# Patient Record
Sex: Male | Born: 2000 | Race: White | Hispanic: No | Marital: Single | Smoking: Never smoker
Health system: Southern US, Community
[De-identification: ages and names within clinical notes are randomized; demographics above are authoritative.]

---
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
## 2019-08-05 ENCOUNTER — Ambulatory Visit: Payer: Self-pay | Attending: Internal Medicine

## 2019-08-05 DIAGNOSIS — Z20822 Contact with and (suspected) exposure to covid-19: Secondary | ICD-10-CM

## 2019-08-07 LAB — NOVEL CORONAVIRUS, NAA: SARS-CoV-2, NAA: NOT DETECTED
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
## 2024-01-14 ENCOUNTER — Encounter: Payer: Self-pay | Admitting: Physician Assistant

## 2024-01-14 ENCOUNTER — Ambulatory Visit: Admitting: Physician Assistant

## 2024-01-14 VITALS — BP 119/75 | HR 74 | Resp 16 | Ht 68.0 in | Wt 165.0 lb

## 2024-01-14 DIAGNOSIS — Z0001 Encounter for general adult medical examination with abnormal findings: Secondary | ICD-10-CM | POA: Diagnosis not present

## 2024-01-14 DIAGNOSIS — Z136 Encounter for screening for cardiovascular disorders: Secondary | ICD-10-CM

## 2024-01-14 DIAGNOSIS — Z1159 Encounter for screening for other viral diseases: Secondary | ICD-10-CM

## 2024-01-14 DIAGNOSIS — Z7689 Persons encountering health services in other specified circumstances: Secondary | ICD-10-CM

## 2024-01-14 DIAGNOSIS — E049 Nontoxic goiter, unspecified: Secondary | ICD-10-CM | POA: Diagnosis not present

## 2024-01-14 DIAGNOSIS — Z13228 Encounter for screening for other metabolic disorders: Secondary | ICD-10-CM

## 2024-01-14 DIAGNOSIS — Z0000 Encounter for general adult medical examination without abnormal findings: Secondary | ICD-10-CM | POA: Insufficient documentation

## 2024-01-14 DIAGNOSIS — L409 Psoriasis, unspecified: Secondary | ICD-10-CM

## 2024-01-14 DIAGNOSIS — Z114 Encounter for screening for human immunodeficiency virus [HIV]: Secondary | ICD-10-CM

## 2024-01-14 DIAGNOSIS — Z23 Encounter for immunization: Secondary | ICD-10-CM

## 2024-01-14 NOTE — Progress Notes (Signed)
 New patient visit  Patient: Derek Sharp   DOB: 10/16/2000   23 y.o. Male  MRN: [id number] Visit Date: 01/14/2024  Today's healthcare provider: Jolynn Spencer, PA-C   Chief Complaint  Patient presents with   Follow-up    Vaccine for school    Subjective    Derek Sharp is a 23 y.o. male who presents today as a new patient to establish care.   Discussed the use of AI scribe software for clinical note transcription with the patient, who gave verbal consent to proceed.  History of Present Illness Derek Sharp is a 23 year old male who presents for a routine check-up and tetanus vaccination.  He has psoriasis, which is mostly resolved, and is managed with topical cortisol cream under dermatological care. He has no issues with headaches, vision changes, or respiratory difficulties. He wears contacts and has annual eye exams. He maintains regular dental visits twice a year. He reports no gastrointestinal or urinary problems. He has no history of fatigue, palpitations, chest pain, or dyspnea. He donates plasma for extra income, with blood work done a year ago. His maternal grandfather had a stroke at 29 but lived until 81, with no other known family history of cardiovascular disease.    No past medical history on file. The histories are not reviewed yet. Please review them in the History navigator section and refresh this SmartLink. No family status information on file.   No family history on file. Social History   Socioeconomic History   Marital status: Single    Spouse name: Not on file   Number of children: Not on file   Years of education: Not on file   Highest education level: Not on file  Occupational History   Not on file  Tobacco Use   Smoking status: Never   Smokeless tobacco: Never  Substance and Sexual Activity   Alcohol use: Never   Drug use: Not on file   Sexual activity: Not on file  Other Topics Concern   Not on file  Social History Narrative   Not on file    Social Drivers of Health   Financial Resource Strain: Not on file  Food Insecurity: Not on file  Transportation Needs: Not on file  Physical Activity: Not on file  Stress: Not on file  Social Connections: Not on file   No outpatient medications prior to visit.   No facility-administered medications prior to visit.   No Known Allergies  Immunization History  Administered Date(s) Administered   Dtap, Unspecified 04/02/2000, 05/28/2000, 07/19/2000, 04/29/2001, 03/24/2004   HIB, Unspecified 04/02/2000, 05/28/2000, 07/19/2000, 04/29/2001   HPV 9-valent 01/04/2015, 01/06/2016   Hep A, Unspecified 04/09/2006   Hep B, Unspecified 2000/04/04, 02/27/2000, 10/25/2000   Hepatitis A, Ped/Adol-2 Dose 04/16/2007   Influenza Nasal 04/16/2007   MMR 02/12/2001, 02/22/2004   Meningococcal B, OMV 01/14/2018, 02/12/2019   Meningococcal Mcv4o 11/05/2013, 01/12/2017   PFIZER(Purple Top)SARS-COV-2 Vaccination 07/27/2020   Pneumococcal Conjugate PCV 7 04/02/2000, 05/28/2000, 07/19/2000, 02/12/2001   Polio, Unspecified 04/02/2000, 05/28/2000, 10/25/2000, 03/24/2004   Tdap 01/06/2011, 01/14/2024   Varicella 02/12/2001, 04/09/2006    Health Maintenance  Topic Date Due   HIV Screening  Never done   Hepatitis C Screening  Never done   COVID-19 Vaccine (2 - 2024-25 season) 01/30/2024 (Originally 03/25/2023)   INFLUENZA VACCINE  02/22/2024   DTaP/Tdap/Td (8 - Td or Tdap) 01/13/2034   HPV VACCINES  Completed   Meningococcal B Vaccine  Completed    Patient Care Team: [place],  Shaya Altamura, PA-C as PCP - General (Physician Assistant)  Review of Systems  All other systems reviewed and are negative.  Except see HPI       Objective    BP 119/75 (BP Location: Right Arm, Patient Position: Sitting)   Pulse 74   Resp 16   Ht 5' 8 (1.727 m)   Wt 165 lb (74.8 kg)   SpO2 99%   BMI 25.09 kg/m     Physical Exam Vitals reviewed.  Constitutional:      General: He is not in acute distress.     Appearance: Normal appearance. He is not diaphoretic.  HENT:     Head: Normocephalic and atraumatic.   Eyes:     General: No scleral icterus.    Conjunctiva/sclera: Conjunctivae normal.    Cardiovascular:     Rate and Rhythm: Normal rate and regular rhythm.     Pulses: Normal pulses.     Heart sounds: Normal heart sounds. No murmur heard. Pulmonary:     Effort: Pulmonary effort is normal. No respiratory distress.     Breath sounds: Normal breath sounds. No wheezing or rhonchi.   Musculoskeletal:     Cervical back: Neck supple.     Right lower leg: No edema.     Left lower leg: No edema.  Lymphadenopathy:     Cervical: No cervical adenopathy.   Skin:    General: Skin is warm and dry.     Findings: No rash.   Neurological:     Mental Status: He is alert and oriented to person, place, and time. Mental status is at baseline.   Psychiatric:        Mood and Affect: Mood normal.        Behavior: Behavior normal.   Gu exam was deferred Depression Screen    01/14/2024    3:27 PM  PHQ 2/9 Scores  PHQ - 2 Score 0  PHQ- 9 Score 0   No results found for any visits on 01/14/24.  Assessment & Plan      Assessment & Plan Thyroid enlargement Possible thyroid enlargement noted. Differential includes benign causes or thyroid pathology. No dysfunction symptoms. Further evaluation required. - Order TSH test and thyroid ultrasound. - Schedule urgent ultrasound.  Psoriasis Chronic Psoriasis well-controlled with topical corticosteroids. Discussed management to prevent complications. - Continue topical corticosteroid treatment. Will follow-up  General Health Maintenance Up to date with dental and eye exams. Discussed importance of vaccinations and screenings. Recommended HIV and hepatitis C screenings. - Administer tetanus vaccine. - Order HIV and hepatitis C screening. - Order CBC, CMP, and fasting lipid panel. - Advise fasting after midnight for lipid panel. - Arrange lab  work for today or tomorrow morning.  Encounter for special screening examination for cardiovascular disorder - Lipid panel  Need for hepatitis C screening test - Hepatitis C antibody   Encounter for screening for HIV - HIV Antibody (routine testing w rflx)   Encounter for screening for cardiovascular disorders Encounter for metabolic disorders - CBC with Differential/Platelet - Comprehensive metabolic panel with GFR  Goiter  - TSH - US  THYROID; Future  Physical exam, annual (Primary) UTD on dental/eye Things to do to keep yourself healthy  - Exercise at least 30-45 minutes a day, 3-4 days a week.  - Eat a low-fat diet with lots of fruits and vegetables, up to 7-9 servings per day.  - Seatbelts can save your life. Wear them always.  - Smoke detectors on every level of your  home, check batteries every year.  - Eye Doctor - have an eye exam every 1-2 years  - Safe sex - if you may be exposed to STDs, use a condom.  - Alcohol -  If you drink, do it moderately, less than 2 drinks per day.  - Health Care Power of Attorney. Choose someone to speak for you if you are not able.  - Depression is common in our stressful world.If you're feeling down or losing interest in things you normally enjoy, please come in for a visit.  - Violence - If anyone is threatening or hurting you, please call immediately.   Encounter to establish care Welcomed to our clinic Reviewed past medical hx, social hx, family hx and surgical hx Pt advised to send all vaccination records or screening   No follow-ups on file.    The patient was advised to call back or seek an in-person evaluation if the symptoms worsen or if the condition fails to improve as anticipated.  I discussed the assessment and treatment plan with the patient. The patient was provided an opportunity to ask questions and all were answered. The patient agreed with the plan and demonstrated an understanding of the instructions.  I, Camyra Vaeth, PA-C have reviewed all documentation for this visit. The documentation on  01/14/2024   for the exam, diagnosis, procedures, and orders are all accurate and complete.  Jolynn Spencer, [facility], MMS [facility] [phone] (phone) [phone] (fax)  [facility]

## 2024-01-15 LAB — LIPID PANEL
Chol/HDL Ratio: 1.9 ratio (ref 0.0–5.0)
Cholesterol, Total: 184 mg/dL (ref 100–199)
HDL: 95 mg/dL (ref >39)
LDL Chol Calc (NIH): 73 mg/dL (ref 0–99)
Triglycerides: 91 mg/dL (ref 0–149)
VLDL Cholesterol Cal: 16 mg/dL (ref 5–40)

## 2024-01-15 LAB — HIV ANTIBODY (ROUTINE TESTING W REFLEX): HIV Screen 4th Generation wRfx: NONREACTIVE

## 2024-01-15 LAB — HEPATITIS C ANTIBODY: Hep C Virus Ab: NONREACTIVE

## 2024-01-21 ENCOUNTER — Ambulatory Visit
Admission: RE | Admit: 2024-01-21 | Discharge: 2024-01-21 | Disposition: A | Discharge status: Home / Self Care | Source: Ambulatory Visit | Attending: Physician Assistant | Admitting: Physician Assistant

## 2024-01-21 ENCOUNTER — Ambulatory Visit: Payer: Self-pay | Admitting: Physician Assistant

## 2024-01-21 DIAGNOSIS — E049 Nontoxic goiter, unspecified: Secondary | ICD-10-CM | POA: Insufficient documentation

## 2024-01-21 DIAGNOSIS — E010 Iodine-deficiency related diffuse (endemic) goiter: Secondary | ICD-10-CM | POA: Diagnosis not present
